# Patient Record
Sex: Female | Born: 1975 | Race: White | Hispanic: No | Marital: Married | Smoking: Current every day smoker
Health system: Southern US, Community
[De-identification: ages and names within clinical notes are randomized; demographics above are authoritative.]

## PROBLEM LIST (undated history)

## (undated) DIAGNOSIS — F1110 Opioid abuse, uncomplicated: Secondary | ICD-10-CM

---

## 2005-12-18 ENCOUNTER — Emergency Department (HOSPITAL_COMMUNITY): Admission: EM | Admit: 2005-12-18 | Discharge: 2005-12-18 | Payer: Self-pay | Admitting: Family Medicine

## 2011-12-20 ENCOUNTER — Other Ambulatory Visit: Payer: Self-pay | Admitting: Infectious Diseases

## 2011-12-20 ENCOUNTER — Ambulatory Visit
Admission: RE | Admit: 2011-12-20 | Discharge: 2011-12-20 | Disposition: A | Discharge status: Home / Self Care | Payer: PRIVATE HEALTH INSURANCE | Source: Ambulatory Visit | Attending: Infectious Diseases | Admitting: Infectious Diseases

## 2011-12-20 DIAGNOSIS — R7611 Nonspecific reaction to tuberculin skin test without active tuberculosis: Secondary | ICD-10-CM

## 2012-09-03 ENCOUNTER — Encounter (HOSPITAL_COMMUNITY): Payer: Self-pay | Admitting: *Deleted

## 2012-09-03 ENCOUNTER — Emergency Department (HOSPITAL_COMMUNITY)
Admission: EM | Admit: 2012-09-03 | Discharge: 2012-09-03 | Disposition: A | Discharge status: Home / Self Care | Payer: Self-pay | Attending: Emergency Medicine | Admitting: Emergency Medicine

## 2012-09-03 DIAGNOSIS — F1120 Opioid dependence, uncomplicated: Secondary | ICD-10-CM | POA: Insufficient documentation

## 2012-09-03 DIAGNOSIS — T401X1A Poisoning by heroin, accidental (unintentional), initial encounter: Secondary | ICD-10-CM | POA: Insufficient documentation

## 2012-09-03 DIAGNOSIS — T401X4A Poisoning by heroin, undetermined, initial encounter: Secondary | ICD-10-CM | POA: Insufficient documentation

## 2012-09-03 HISTORY — DX: Opioid abuse, uncomplicated: F11.10

## 2012-09-03 MED ORDER — NALOXONE HCL 1 MG/ML IJ SOLN
INTRAMUSCULAR | Status: AC
  Filled 2012-09-03: qty 2

## 2012-09-03 NOTE — ED Notes (Signed)
Pt sitting up and eating ice chips. Pt has eyes closed and appears to be having a hard time staying awake. Pt responds appropriately to verbal stimulus.

## 2012-09-03 NOTE — ED Notes (Signed)
[facility] d/c'ed.

## 2012-09-03 NOTE — ED Notes (Signed)
Pt reports heroin use, unknown amt, very small. Sts she has a "low tolerance" and believes she took too much. Pt denies SI/HI. Given 2mg  Narcan en route and now A&O.

## 2012-09-03 NOTE — ED Notes (Signed)
MD at bedside. Dr. Lynelle Doctor at bedside speaking with pt.

## 2012-09-03 NOTE — ED Provider Notes (Signed)
History    36 year old female with heroin overdose. Patient states that this was unintentional. On EMS arrival patient was poorly responsive with shallow respirations. She responded to Narcan. On arrival to ED patient was awake and oriented. Responding appropriately to questions. She denies any coingestions. Denies suicidal or homicidal ideation.  CSN: [id number]  Arrival date & time 09/03/12  1219   First MD Initiated Contact with Patient 09/03/12 1342      Chief Complaint  Patient presents with  . Drug Overdose    (Consider location/radiation/quality/duration/timing/severity/associated sxs/prior treatment) HPI  Past Medical History  Diagnosis Date  . Opiate abuse, continuous     No past surgical history on file.  No family history on file.  History  Substance Use Topics  . Smoking status: Not on file  . Smokeless tobacco: Not on file  . Alcohol Use:     OB History    Grav Para Term Preterm Abortions TAB SAB Ect Mult Living                  Review of Systems   Review of symptoms negative unless otherwise noted in HPI.   Allergies  Review of patient's allergies indicates no known allergies.  Home Medications  No current outpatient prescriptions on file.  BP 112/80  Pulse 92  Temp 98.5 F (36.9 C) (Oral)  Resp 18  SpO2 99%  Physical Exam  Nursing note and vitals reviewed. Constitutional: She is oriented to person, place, and time. She appears well-developed and well-nourished. No distress.  HENT:  Head: Normocephalic and atraumatic.  Eyes: Conjunctivae normal are normal. Right eye exhibits no discharge. Left eye exhibits no discharge.  Neck: Neck supple.  Cardiovascular: Normal rate, regular rhythm and normal heart sounds.  Exam reveals no gallop and no friction rub.   No murmur heard. Pulmonary/Chest: Effort normal and breath sounds normal. No respiratory distress.  Abdominal: Soft. She exhibits no distension. There is no tenderness.    Musculoskeletal: She exhibits no edema and no tenderness.  Neurological: She is alert and oriented to person, place, and time. No cranial nerve deficit. She exhibits normal muscle tone. Coordination normal.       Speech clear and content appropriate. Ambulate without difficulty.  Skin: Skin is warm and dry. She is not diaphoretic.  Psychiatric: She has a normal mood and affect. Her behavior is normal. Thought content normal.    ED Course  Procedures (including critical care time)  Labs Reviewed - No data to display No results found.   1. Accidental heroin overdose       MDM    6:45 PM 36 year old female with unintentional heroin overdose. She did receive Narcan having shortly before noon today. She was observed for 3 hours. At this point pt insisting on leaving. Is starting to become a little drowsy but clearly still has medical decision making capability. Explained to her that the half life of narcan is shorter than most opiates and that there is a very real chance she could decompensate after she was discharged. She understands these risks including but not limited to possible anoxic brain injury or death.       Raeford Razor, MD 09/08/12 2253

## 2012-09-03 NOTE — ED Notes (Signed)
Pt in by ems, found at home, lethargic, pinpoint pupils, resp 6/min. Given 2mg  narcan en route 11:48a, pt became arousable, currently A&O. 20g IV R hand. Pt reports heroin use, unknown amt, denies SI/HI.

## 2016-06-22 ENCOUNTER — Encounter (HOSPITAL_COMMUNITY): Payer: Self-pay | Admitting: Emergency Medicine

## 2016-06-22 ENCOUNTER — Emergency Department (HOSPITAL_COMMUNITY)
Admission: EM | Admit: 2016-06-22 | Discharge: 2016-06-22 | Discharge status: Against Medical Advice | Payer: PRIVATE HEALTH INSURANCE | Attending: Emergency Medicine | Admitting: Emergency Medicine

## 2016-06-22 DIAGNOSIS — R55 Syncope and collapse: Secondary | ICD-10-CM | POA: Insufficient documentation

## 2016-06-22 DIAGNOSIS — R4189 Other symptoms and signs involving cognitive functions and awareness: Secondary | ICD-10-CM

## 2016-06-22 DIAGNOSIS — R4182 Altered mental status, unspecified: Secondary | ICD-10-CM | POA: Insufficient documentation

## 2016-06-22 NOTE — ED Notes (Signed)
Patient refusing to answer questions

## 2016-06-22 NOTE — Discharge Instructions (Signed)
You are leaving AGAINST MEDICAL ADVICE. I recommended further tests including bloodwork, ECG and possibly other tests but you have refused these. If at any point you feel worse or change your mind come back to the ER for evaluation.

## 2016-06-22 NOTE — ED Notes (Signed)
Patient here via EMS from home with complaints of drug overdose suspected heroine. Found white substance by patient at home. Zero narcan given. 98% RA.

## 2016-06-22 NOTE — ED Provider Notes (Signed)
CSN: [id number]     Arrival date & time 06/22/16  1535 History   First MD Initiated Contact with Patient 06/22/16 1548     Chief Complaint  Patient presents with  . Drug Overdose     (Consider location/radiation/quality/duration/timing/severity/associated sxs/prior Treatment) HPI   40 year old female presents by EMS after being found unresponsive. EMS states a female in the living area told him he found a white powdery substance next to her and was concerned that she had possibly overdosed. EMS is no longer present by the time I saw the patient and the patient is refusing to answer most questions. Immediately upon me walking into the room she asked to speak to her lawyer. When I discussed needing to do a medical exam she briefly tells me some history and allows me to do an exam. She denies alcohol or drug use today. She does not know why EMS was called or who called them. She denies feeling ill. She denies fevers or headache.  Past Medical History  Diagnosis Date  . Opiate abuse, continuous    History reviewed. No pertinent past surgical history. No family history on file. Social History  Substance Use Topics  . Smoking status: Unknown If Ever Smoked  . Smokeless tobacco: None  . Alcohol Use: No   OB History    No data available     Review of Systems  Constitutional: Negative for fever.  Respiratory: Negative for shortness of breath.   Cardiovascular: Negative for chest pain.  Gastrointestinal: Negative for vomiting.  Neurological: Positive for syncope. Negative for headaches.  All other systems reviewed and are negative.     Allergies  Review of patient's allergies indicates no known allergies.  Home Medications   Prior to Admission medications   Not on File   BP 110/87 mmHg  Pulse 105  Temp(Src) 97.9 F (36.6 C) (Oral)  Resp 20  SpO2 97% Physical Exam  Constitutional: She is oriented to person, place, and time. She appears well-developed and well-nourished.   Mildly lethargic but is awake and alert  HENT:  Head: Normocephalic and atraumatic.  Right Ear: External ear normal.  Left Ear: External ear normal.  Nose: Nose normal.  Eyes: EOM are normal. Pupils are equal, round, and reactive to light. Right eye exhibits no discharge. Left eye exhibits no discharge.  Neck: Neck supple.  Cardiovascular: Normal rate, regular rhythm and normal heart sounds.   No murmur heard. Pulmonary/Chest: Effort normal and breath sounds normal.  Abdominal: She exhibits no distension.  Neurological: She is alert and oriented to person, place, and time.  Alert, oriented to self, place. Knows it is July 2017 but is off on the day of the week. Knows the president. No facial droop. 5/5 strength in all 4 extremities. Grossly normal sensation. Normal gait  Skin: Skin is warm and dry.  Nursing note and vitals reviewed.   ED Course  Procedures (including critical care time) Labs Review Labs Reviewed - No data to display  Imaging Review No results found. I have personally reviewed and evaluated these images and lab results as part of my medical decision-making.   EKG Interpretation None      MDM   Final diagnoses:  Episode of unresponsiveness    Patient likely had an accidental overdose. She has had overdoses in the past and was seen here a few years ago for similar presentation. However currently she is awake and alert. Her family, mom and boyfriend, have come to pick her up. She  is refusing all evaluation and treatment besides a basic exam which is unremarkable. I discussed that patient is leaving AGAINST MEDICAL ADVICE and discussed possible adverse events from this including misdiagnoses and death. She verbalized understanding. She is still demanding to leave. Her vital signs are unremarkable and at this point I think that she has adequate medical decision-making capacity and cannot be kept against her will.   Pricilla Loveless, MD 06/22/16 [phone]

## 2018-04-11 ENCOUNTER — Ambulatory Visit: Payer: Self-pay | Admitting: Nurse Practitioner

## 2018-04-11 VITALS — BP 115/85 | HR 100 | Temp 98.2°F | Resp 16 | Wt 177.2 lb

## 2018-04-11 DIAGNOSIS — Z207 Contact with and (suspected) exposure to pediculosis, acariasis and other infestations: Secondary | ICD-10-CM

## 2018-04-11 DIAGNOSIS — Z2089 Contact with and (suspected) exposure to other communicable diseases: Secondary | ICD-10-CM

## 2018-04-11 MED ORDER — PERMETHRIN 5 % EX CREA: 1.0000 "application " | TOPICAL_CREAM | Freq: Once | CUTANEOUS | 0 refills | Status: AC

## 2018-04-11 NOTE — Patient Instructions (Signed)
Scabies, Adult Scabies is a skin condition that happens when very small insects get under the skin (infestation). This causes a rash and severe itchiness. Scabies can spread from person to person (is contagious). If you get scabies, it is common for others in your household to get scabies too. With proper treatment, symptoms usually go away in 2-4 weeks. Scabies usually does not cause lasting problems. What are the causes? This condition is caused by mites (Sarcoptes scabiei, or human itch mites) that can only be seen with a microscope. The mites get into the top layer of skin and lay eggs. Scabies can spread from person to person through:  Close contact with a person who has scabies.  Contact with infested items, such as towels, bedding, or clothing.  What increases the risk? This condition is more likely to develop in:  People who live in nursing homes and other extended-care facilities.  People who have sexual contact with a partner who has scabies.  Young children who attend child care facilities.  People who care for others who are at increased risk for scabies.  What are the signs or symptoms? Symptoms of this condition may include:  Severe itchiness. This is often worse at night.  A rash that includes tiny red bumps or blisters. The rash commonly occurs on the wrist, elbow, armpit, fingers, waist, groin, or buttocks. Bumps may form a line (burrow) in some areas.  Skin irritation. This can include scaly patches or sores.  How is this diagnosed? This condition is diagnosed with a physical exam. Your health care provider will look closely at your skin. In some cases, your health care provider may take a sample of your affected skin (skin scraping) and have it examined under a microscope. How is this treated? This condition may be treated with:  Medicated cream or lotion that kills the mites. This is spread on the entire body and left on for several hours. Usually, one treatment  with medicated cream or lotion is enough to kill all of the mites. In severe cases, the treatment may be repeated.  Medicated cream that relieves itching.  Medicines that help to relieve itching.  Medicines that kill the mites. This treatment is rarely used.  Follow these instructions at home:  Medicines  Take or apply over-the-counter and prescription medicines as told by your health care provider.  Apply medicated cream or lotion as told by your health care provider.  Do not wash off the medicated cream or lotion until the necessary amount of time has passed. Skin Care  Avoid scratching your affected skin.  Keep your fingernails closely trimmed to reduce injury from scratching.  Take cool baths or apply cool washcloths to help reduce itching. General instructions  Clean all items that you recently had contact with, including bedding, clothing, and furniture. Do this on the same day that your treatment starts. ? Use hot water when you wash items. ? Place unwashable items into closed, airtight plastic bags for at least 3 days. The mites cannot live for more than 3 days away from human skin. ? Vacuum furniture and mattresses that you use.  Make sure that other people who may have been infested are examined by a health care provider. These include members of your household and anyone who may have had contact with infested items.  Keep all follow-up visits as told by your health care provider. This is important. Contact a health care provider if:  You have itching that does not go away   after 4 weeks of treatment.  You continue to develop new bumps or burrows.  You have redness, swelling, or pain in your rash area after treatment.  You have fluid, blood, or pus coming from your rash. This information is not intended to replace advice given to you by your health care provider. Make sure you discuss any questions you have with your health care provider. Document Released:  08/11/2015 Document Revised: 04/27/2016 Document Reviewed: 06/22/2015 Elsevier Interactive Patient Education  2018 [employer]  

## 2018-04-11 NOTE — Progress Notes (Addendum)
Subjective:   Patient is a 42 year old female who presents for complaints of scabies.  The patient states she was exposed from her boyfriend as he and her mother own a business and they were exposed at that time.  Symptoms started approximately 2 to 3 days ago.  Patient states her boyfriend was seen here at [facility] given permethrin, use the boyfriends medication and is still having symptoms.  The patient does not mention any complaints of itching, or discomfort to any of the expected affected areas.  She presented wanting certain medications to treat this condition.  Patient states her boyfriend was seen here previously and at that time her whole family was treated without being seen by that provider.  Patient states she used her boyfriend's treatment, but felt that she needed another treatment to ensure all of the scabies were gone.   Patient brought boyfriend into the room, and they persistently requested certain medications to treat this condition.  Explained to them that determination of treatment will be made once the assessment has been completed.  Objective:   Review of Systems  Constitutional: Negative.   HENT: Negative.   Eyes: Negative.   Respiratory: Negative.   Cardiovascular: Negative.   Skin:       Rash to generalized body  Psychiatric/Behavioral: The patient is nervous/anxious.     Physical Exam  Constitutional: She appears well-developed and well-nourished. No distress.  HENT:  Head: Normocephalic and atraumatic.  Cardiovascular: Normal rate, regular rhythm and normal heart sounds.  Pulmonary/Chest: Effort normal and breath sounds normal.  Skin: Skin is warm and dry. No erythema.  Dry skin to patient's forehead only.  Patient was wearing gloves and was hesitant to remove them for exam.  Patient had no rash on her feet, hands, axillary, antecubitals, with no mention of any rash to the genitalia or buttocks.  Psychiatric:  Patient very anxiouos and later became irate during  this visit.      Assessment:   Scabies Exposure    Plan:   1.  Patient and boyfriend became upset when I explained to them I was only prescribing permethrin topical.  Attempted to explain the efficacy of permethrin versus ivermectin and that there was no real evidence that one was more effective than the other.  Explained that permethrin can be used and can be repeated if necessary.  Patient became very irate, stormed out of the room, using expletives that were very inappropriate and offensive to this provider. Patient stated, "F&^k you and I hope you die as she walked out of the office.   Patient's boyfriend did apologize before leaving.  2.  Patient education provided.

## 2018-09-13 ENCOUNTER — Emergency Department (HOSPITAL_COMMUNITY): Payer: PRIVATE HEALTH INSURANCE

## 2018-09-13 ENCOUNTER — Emergency Department (HOSPITAL_COMMUNITY)
Admission: EM | Admit: 2018-09-13 | Discharge: 2018-09-13 | Disposition: A | Discharge status: Home / Self Care | Payer: PRIVATE HEALTH INSURANCE | Attending: Emergency Medicine | Admitting: Emergency Medicine

## 2018-09-13 ENCOUNTER — Encounter (HOSPITAL_COMMUNITY): Payer: Self-pay

## 2018-09-13 DIAGNOSIS — W19XXXA Unspecified fall, initial encounter: Secondary | ICD-10-CM | POA: Insufficient documentation

## 2018-09-13 DIAGNOSIS — Y999 Unspecified external cause status: Secondary | ICD-10-CM | POA: Insufficient documentation

## 2018-09-13 DIAGNOSIS — F17210 Nicotine dependence, cigarettes, uncomplicated: Secondary | ICD-10-CM | POA: Insufficient documentation

## 2018-09-13 DIAGNOSIS — Y939 Activity, unspecified: Secondary | ICD-10-CM | POA: Insufficient documentation

## 2018-09-13 DIAGNOSIS — R569 Unspecified convulsions: Secondary | ICD-10-CM

## 2018-09-13 DIAGNOSIS — R55 Syncope and collapse: Secondary | ICD-10-CM | POA: Insufficient documentation

## 2018-09-13 DIAGNOSIS — Y929 Unspecified place or not applicable: Secondary | ICD-10-CM | POA: Insufficient documentation

## 2018-09-13 NOTE — ED Notes (Addendum)
Two unsuccessful blood draw attempts. Nurse tech will attempt.

## 2018-09-13 NOTE — ED Triage Notes (Signed)
[facility] reports family stated pt possibly had a seizure and fell knocking out teeth. Family also suggested pt maybe have taken too much xanax as one of the family cats was PTS today. The home is a hoarder situation and GEMS saw bedbugs. On arrival pt was agitated and disoriented, but became A&Ox4 on the way to the ED. GEMS states pt seems to have significant BH issues.  She has been tachy at 130-140 cbg 82 125/72.

## 2018-09-13 NOTE — ED Notes (Signed)
EMS stated pt had bed bugs. Pt was deconed upon arrival to ED from EMS. Pt returned safely to bedside with vs WDL.

## 2018-09-13 NOTE — ED Provider Notes (Signed)
[facility] EMERGENCY DEPARTMENT Provider Note   CSN: [id number] Arrival date & time: 09/13/18  1602     History   Chief Complaint Chief Complaint  Patient presents with  . Altered Mental Status    HPI Kayla Hall is a 42 y.o. female.  HPI   42 year old female with seizure-like activity.  She was at home when family heard a scream from an adjacent room and then a thud.  Patient was found on the ground unresponsive and rigid.  Afterwards she seemed confused.  Episode lasted about 45 seconds.  No incontinence.  She reports poor sleep recently.  She was up all night with her dying cat when she had to euthanize earlier today.   Past Medical History:  Diagnosis Date  . Opiate abuse, continuous (HCC)     There are no active problems to display for this patient.   History reviewed. No pertinent surgical history.   OB History   None      Home Medications    Prior to Admission medications   Medication Sig Start Date End Date Taking? Authorizing Provider  permethrin (ELIMITE) 5 % cream permethrin 5 % topical cream  APPLY (THOROUGHLY MASSAGE INTO SKIN FROM HEAD TO SOLES OF FEET) BY TOPICAL ROUTE ONCE LEAVE ON FOR 8-14 HR, THEN REMOVE BY THOROUGH WASHING    [provider]    Family History History reviewed. No pertinent family history.  Social History Social History   Tobacco Use  . Smoking status: Current Every Day Smoker    Packs/day: 2.00    Years: 25.00    Pack years: 50.00    Types: Cigarettes  . Smokeless tobacco: Never Used  Substance Use Topics  . Alcohol use: No  . Drug use: Not on file     Allergies   Patient has no known allergies.   Review of Systems Review of Systems  All systems reviewed and negative, other than as noted in HPI.  Physical Exam Updated Vital Signs BP 123/83   Pulse 86   Temp 98.3 F (36.8 C) (Oral)   Resp 17   Ht 5\' 7"  (1.702 m)   Wt 77.1 kg   LMP 09/01/2018   SpO2 100%   BMI  26.63 kg/m   Physical Exam  Constitutional: She is oriented to person, place, and time. She appears well-developed and well-nourished. No distress.  HENT:  Head: Normocephalic and atraumatic.  Eyes: Conjunctivae are normal. Right eye exhibits no discharge. Left eye exhibits no discharge.  Neck: Neck supple.  Cardiovascular: Normal rate, regular rhythm and normal heart sounds. Exam reveals no gallop and no friction rub.  No murmur heard. Pulmonary/Chest: Effort normal and breath sounds normal. No respiratory distress.  Abdominal: Soft. She exhibits no distension. There is no tenderness.  Musculoskeletal: She exhibits no edema or tenderness.  Neurological: She is alert and oriented to person, place, and time. She displays normal reflexes. No cranial nerve deficit. She exhibits normal muscle tone.  Skin: Skin is warm and dry.  Psychiatric: Her behavior is normal. Thought content normal.  Very odd affect  Nursing note and vitals reviewed.    ED Treatments / Results  Labs (all labs ordered are listed, but only abnormal results are displayed) Labs Reviewed  COMPREHENSIVE METABOLIC PANEL  CBC WITH DIFFERENTIAL/PLATELET    EKG EKG Interpretation  Date/Time:  Friday September 13 2018 16:41:36 EDT Ventricular Rate:  86 PR Interval:    QRS Duration: 110 QT Interval:  367 QTC Calculation:  439 R Axis:   71 Text Interpretation:  Sinus rhythm Borderline prolonged PR interval Consider left atrial enlargement Confirmed by Raeford Razor [phone]) on 09/13/2018 4:51:07 PM   Radiology Ct Head Wo Contrast  Result Date: 09/13/2018 CLINICAL DATA:  Patient with possible seizure. EXAM: CT HEAD WITHOUT CONTRAST TECHNIQUE: Contiguous axial images were obtained from the base of the skull through the vertex without intravenous contrast. COMPARISON:  None. FINDINGS: Brain: Ventricles and sulci are appropriate for patient's age. No evidence for acute cortically based infarct, intracranial hemorrhage,  mass lesion or mass-effect. Vascular: Unremarkable. Skull: Intact. Sinuses/Orbits: Small amount of fluid within the left maxillary sinus. Remainder the paranasal sinuses unremarkable. Mastoid air cells unremarkable. Orbits are unremarkable. Other: None. IMPRESSION: No acute intracranial process. Electronically Signed   By: Annia Belt M.D.   On: 09/13/2018 20:39    Procedures Procedures (including critical care time)  Medications Ordered in ED Medications - No data to display   Initial Impression / Assessment and Plan / ED Course  I have reviewed the triage vital signs and the nursing notes.  Pertinent labs & imaging results that were available during my care of the patient were reviewed by me and considered in my medical decision making (see chart for details).     42 year old female with seizure-like activity. Poor sleep recently because of sick cat which was euthanized today.  Back to baseline.  Neuro exam nonfocal.  CT head without acute abnormality.  She is afebrile.  Hempdynamically stable.  Reports questionable seizure-like activity several years ago.  Deferring from starting antiepileptic at this time.  Outpatient neurology follow-up. Difficulty with labs. Pt is tired of waiting.  She was observed in the emergency room for an extended period of time with no further symptoms.  Advised for close outpatient follow-up.  Driving restrictions discussed.  Emergent return precautions discussed as well.  Final Clinical Impressions(s) / ED Diagnoses   Final diagnoses:  Seizure-like activity [facility])    ED Discharge Orders    None       Raeford Razor, MD 09/16/18 1417

## 2018-09-13 NOTE — ED Notes (Addendum)
CT refused to take pt due to contact precaution of bed bugs. This ED tech notified RN.

## 2019-10-14 IMAGING — CT CT HEAD W/O CM
3 series · 16 of 47 positions shown, 19 images · non-contrast
Comparison: None.

CLINICAL DATA: Patient with possible seizure.

EXAM:
CT HEAD WITHOUT CONTRAST
TECHNIQUE: Contiguous axial images were obtained from the base of the skull
through the vertex without intravenous contrast.

[Series 3: head 5.0 h30s · axial · 0.41mm/px · z∈[-137,+8]mm · 10 of 35 slices shown, 13 images]
[im 3/35  brain]
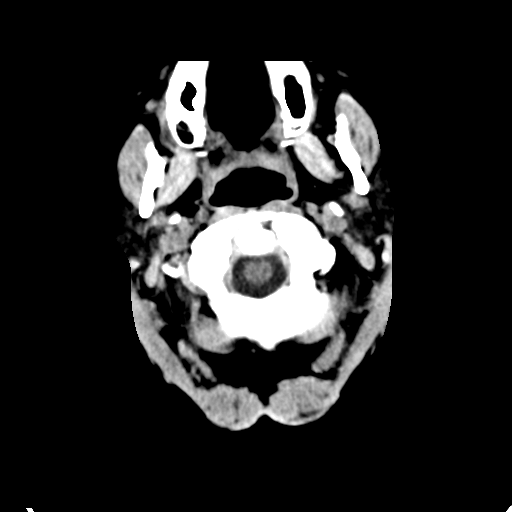
[im 3/35  bone]
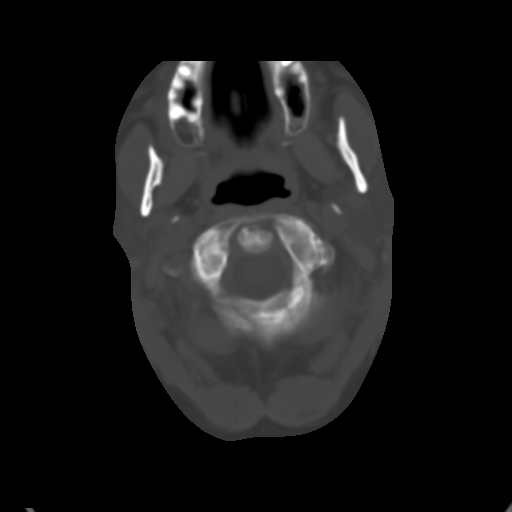
[im 6/35  brain]
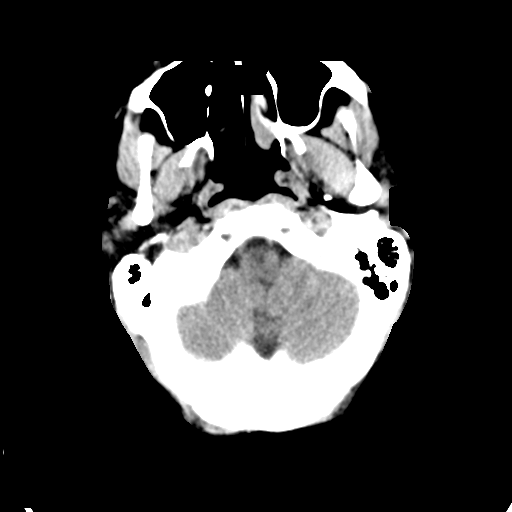
[im 10/35  brain]
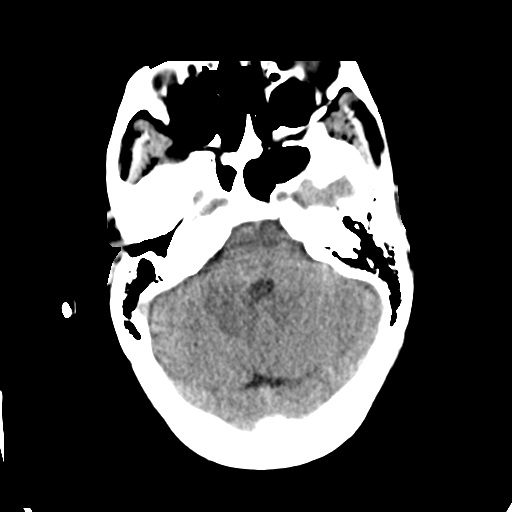
[im 12/35  brain]
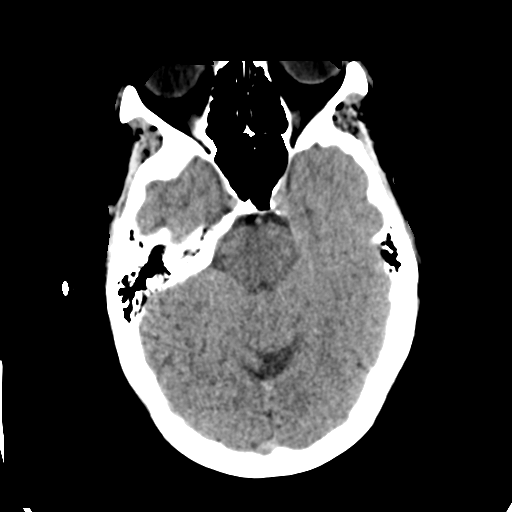
[im 16/35  brain]
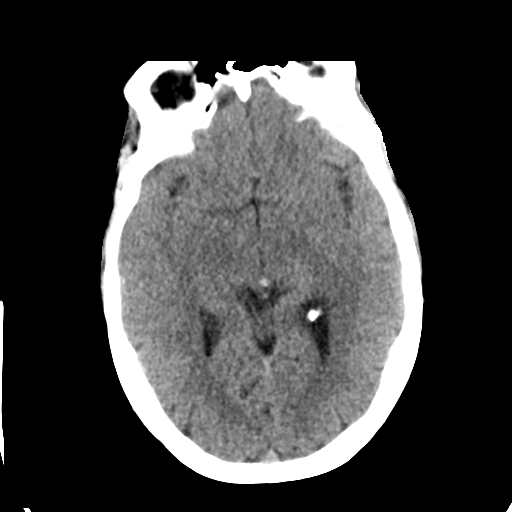
[im 16/35  bone]
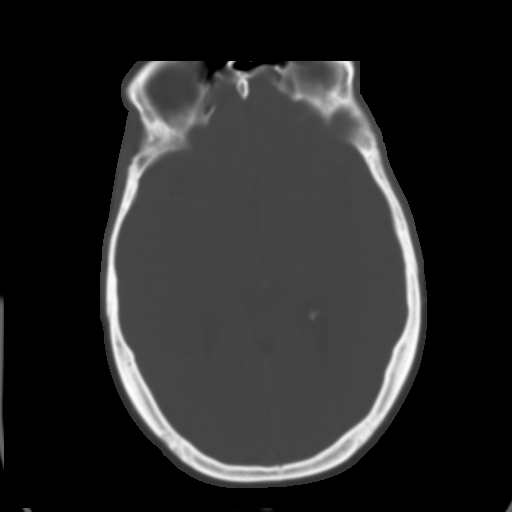
[im 19/35  brain]
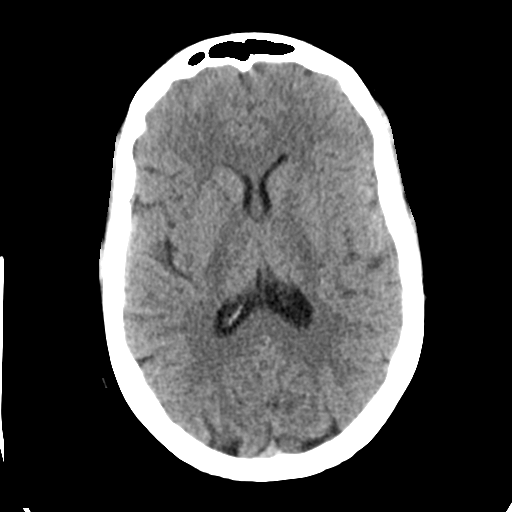
[im 23/35  brain]
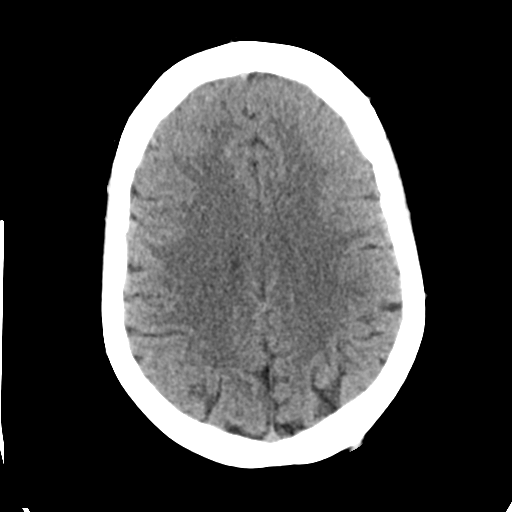
[im 26/35  brain]
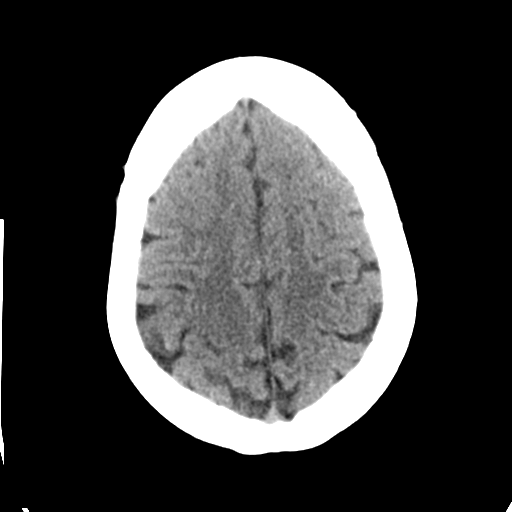
[im 29/35  brain]
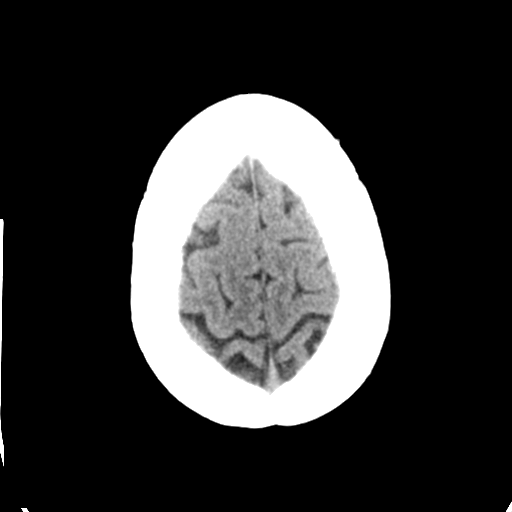
[im 29/35  bone]
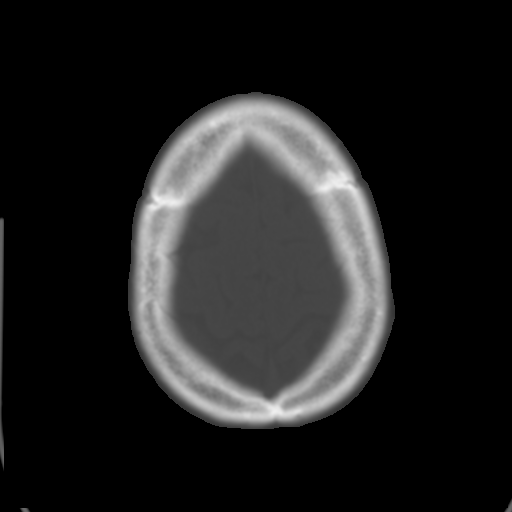
[im 32/35  brain]
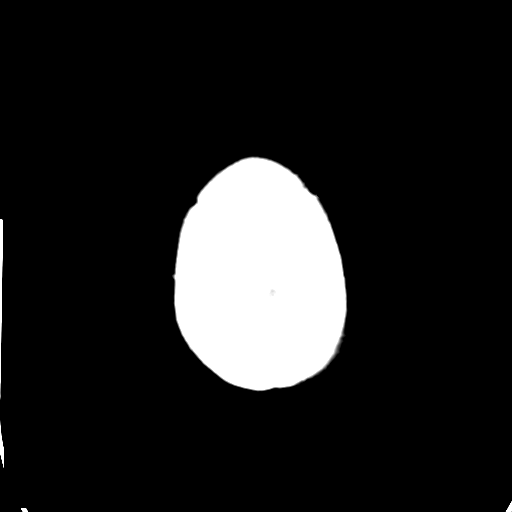

[Series 5: head 3.0 mpr cor · coronal · 0.33mm/px · 3 of 69 slices shown]
[im 23/69  brain]
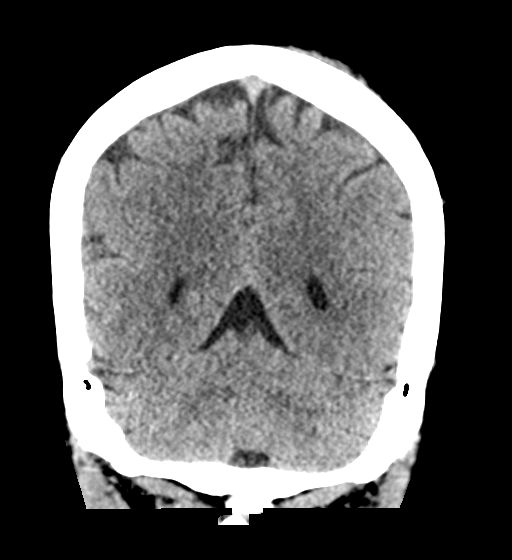
[im 31/69  brain]
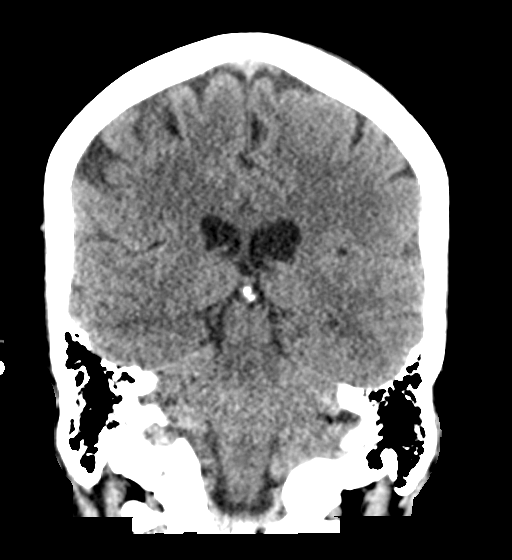
[im 38/69  brain]
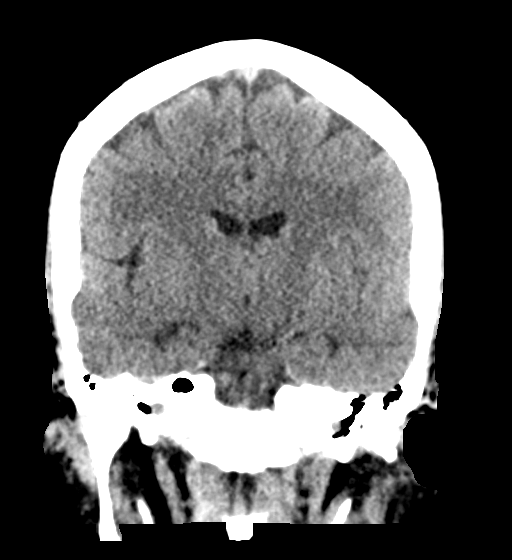

[Series 6: head 3.0 mpr sag · sagittal · 0.35mm/px · 3 of 54 slices shown]
[im 18/54  brain]
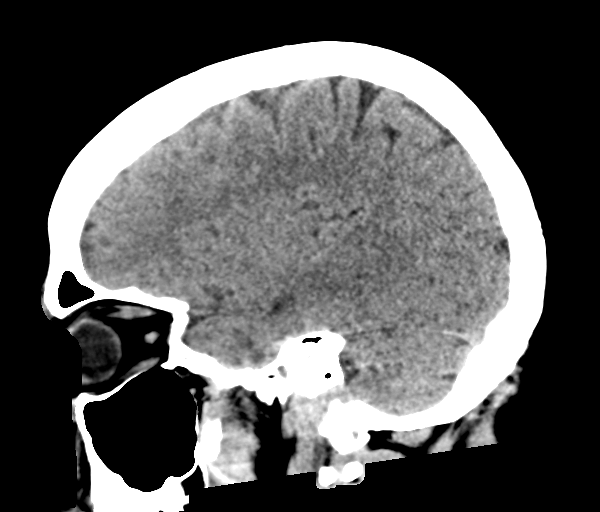
[im 27/54  brain]
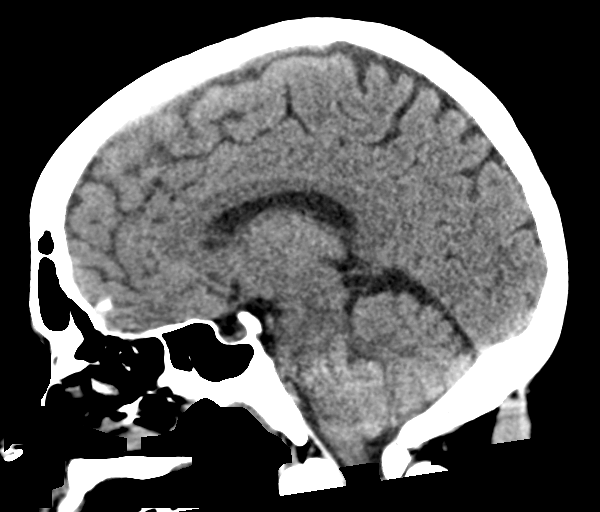
[im 36/54  brain]
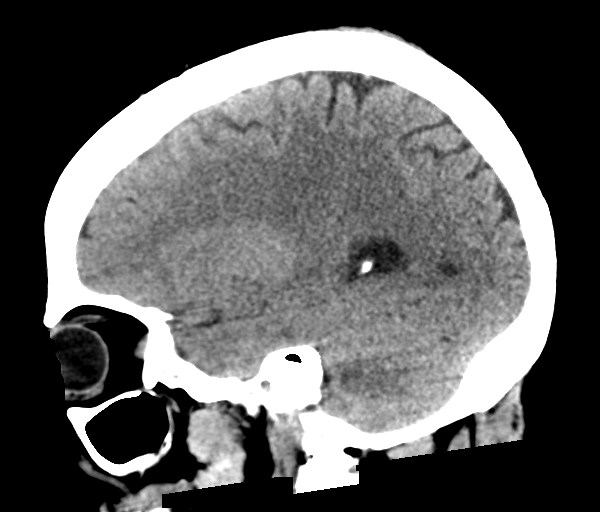

[16 of 47 positions shown; findings below may reference images not displayed]

FINDINGS: Brain: Ventricles and sulci are appropriate for patient's age. No
evidence for acute cortically based infarct, intracranial
hemorrhage, mass lesion or mass-effect.

Vascular: Unremarkable.

Skull: Intact.

Sinuses/Orbits: Small amount of fluid within the left maxillary
sinus. Remainder the paranasal sinuses unremarkable. Mastoid air
cells unremarkable. Orbits are unremarkable.

Other: None.
IMPRESSION: No acute intracranial process.
